# Patient Record
Sex: Female | Born: 1935 | Race: White | Hispanic: No | Marital: Married | Smoking: Former smoker
Health system: Southern US, Community
[De-identification: ages and names within clinical notes are randomized; demographics above are authoritative.]

## PROBLEM LIST (undated history)

## (undated) DIAGNOSIS — F419 Anxiety disorder, unspecified: Secondary | ICD-10-CM

## (undated) DIAGNOSIS — I10 Essential (primary) hypertension: Secondary | ICD-10-CM

## (undated) DIAGNOSIS — I471 Supraventricular tachycardia: Secondary | ICD-10-CM

## (undated) HISTORY — PX: TUBAL LIGATION: SHX77

## (undated) HISTORY — PX: APPENDECTOMY: SHX54

## (undated) HISTORY — DX: Supraventricular tachycardia: I47.1

## (undated) HISTORY — PX: HERNIA REPAIR: SHX51

---

## 1998-09-01 ENCOUNTER — Emergency Department (HOSPITAL_COMMUNITY): Admission: EM | Admit: 1998-09-01 | Discharge: 1998-09-01 | Payer: Self-pay | Admitting: Emergency Medicine

## 1998-09-01 ENCOUNTER — Encounter: Payer: Self-pay | Admitting: Emergency Medicine

## 1999-04-10 ENCOUNTER — Other Ambulatory Visit: Admission: RE | Admit: 1999-04-10 | Discharge: 1999-04-10 | Payer: Self-pay | Admitting: *Deleted

## 2000-08-19 ENCOUNTER — Other Ambulatory Visit: Admission: RE | Admit: 2000-08-19 | Discharge: 2000-08-19 | Payer: Self-pay | Admitting: *Deleted

## 2002-11-02 ENCOUNTER — Other Ambulatory Visit: Admission: RE | Admit: 2002-11-02 | Discharge: 2002-11-02 | Payer: Self-pay | Admitting: Family Medicine

## 2003-12-06 ENCOUNTER — Other Ambulatory Visit: Admission: RE | Admit: 2003-12-06 | Discharge: 2003-12-06 | Payer: Self-pay | Admitting: Family Medicine

## 2004-04-15 ENCOUNTER — Ambulatory Visit (HOSPITAL_COMMUNITY): Admission: RE | Admit: 2004-04-15 | Discharge: 2004-04-15 | Payer: Self-pay | Admitting: Gastroenterology

## 2004-04-15 ENCOUNTER — Encounter (INDEPENDENT_AMBULATORY_CARE_PROVIDER_SITE_OTHER): Payer: Self-pay | Admitting: Specialist

## 2004-06-13 ENCOUNTER — Emergency Department (HOSPITAL_COMMUNITY): Admission: EM | Admit: 2004-06-13 | Discharge: 2004-06-13 | Payer: Self-pay | Admitting: Emergency Medicine

## 2005-08-18 ENCOUNTER — Other Ambulatory Visit: Admission: RE | Admit: 2005-08-18 | Discharge: 2005-08-18 | Payer: Self-pay | Admitting: Family Medicine

## 2007-08-24 ENCOUNTER — Other Ambulatory Visit: Admission: RE | Admit: 2007-08-24 | Discharge: 2007-08-24 | Payer: Self-pay | Admitting: Family Medicine

## 2010-12-13 NOTE — Op Note (Signed)
NAMEGeana, [name]                         ACCOUNT NO.:  [id number]   MEDICAL RECORD NO.:  [id number]                   PATIENT TYPE:  AMB   LOCATION:  ENDO                                 FACILITY:  [facility]   PHYSICIAN:  Graylin Shiver, M.D.                DATE OF BIRTH:  12-22-1932   DATE OF PROCEDURE:  04/15/2004  DATE OF DISCHARGE:                                 OPERATIVE REPORT   INDICATIONS:  Screening.   Informed consent was obtained after the explanation of the risks of  bleeding, infection, and perforation.   PREMEDICATIONS:  Fentanyl 75 mcg IV, Versed 8 mg IV.   PROCEDURE:  With the patient in the left lateral decubitus position, a  rectal exam was performed.  No masses were felt.  The Olympus colonoscope  was inserted into the rectum and advanced around the colon to the cecum.  Cecal landmarks were identified.  The colon was very tortuous.  The cecum  and ascending colon were normal.  The transverse colon normal.  The  descending colon and sigmoid were normal.  In the proximal rectum, there was  a 3 mm polyp, biopsied off with cold forceps.  She tolerated the procedure  well without complications.   IMPRESSION:  A small rectal polyp.   PLAN:  The pathology will be checked.      SFG/MEDQ  D:  04/15/2004  T:  04/15/2004  Job:  [id number]   cc:   Stacie Acres. White, M.D.  [address]  [place]  [place] [address]  Fax: [phone]

## 2013-07-05 ENCOUNTER — Encounter (HOSPITAL_COMMUNITY): Payer: Self-pay | Admitting: Emergency Medicine

## 2013-07-05 ENCOUNTER — Emergency Department (HOSPITAL_COMMUNITY)
Admission: EM | Admit: 2013-07-05 | Discharge: 2013-07-05 | Disposition: A | Discharge status: Home / Self Care | Payer: Medicare Other | Attending: Emergency Medicine | Admitting: Emergency Medicine

## 2013-07-05 ENCOUNTER — Other Ambulatory Visit: Payer: Self-pay

## 2013-07-05 ENCOUNTER — Emergency Department (HOSPITAL_COMMUNITY): Payer: Medicare Other

## 2013-07-05 DIAGNOSIS — M79609 Pain in unspecified limb: Secondary | ICD-10-CM | POA: Insufficient documentation

## 2013-07-05 DIAGNOSIS — Z882 Allergy status to sulfonamides status: Secondary | ICD-10-CM | POA: Insufficient documentation

## 2013-07-05 DIAGNOSIS — I471 Supraventricular tachycardia, unspecified: Secondary | ICD-10-CM | POA: Insufficient documentation

## 2013-07-05 DIAGNOSIS — F411 Generalized anxiety disorder: Secondary | ICD-10-CM | POA: Insufficient documentation

## 2013-07-05 DIAGNOSIS — Z885 Allergy status to narcotic agent status: Secondary | ICD-10-CM | POA: Insufficient documentation

## 2013-07-05 DIAGNOSIS — Z79899 Other long term (current) drug therapy: Secondary | ICD-10-CM | POA: Insufficient documentation

## 2013-07-05 DIAGNOSIS — I10 Essential (primary) hypertension: Secondary | ICD-10-CM | POA: Insufficient documentation

## 2013-07-05 DIAGNOSIS — Z87891 Personal history of nicotine dependence: Secondary | ICD-10-CM | POA: Insufficient documentation

## 2013-07-05 DIAGNOSIS — Z8679 Personal history of other diseases of the circulatory system: Secondary | ICD-10-CM | POA: Insufficient documentation

## 2013-07-05 DIAGNOSIS — Z7982 Long term (current) use of aspirin: Secondary | ICD-10-CM | POA: Insufficient documentation

## 2013-07-05 HISTORY — DX: Essential (primary) hypertension: I10

## 2013-07-05 LAB — COMPREHENSIVE METABOLIC PANEL
ALT: 17 U/L (ref 0–35)
Albumin: 3.5 g/dL (ref 3.5–5.2)
Alkaline Phosphatase: 79 U/L (ref 39–117)
BUN: 16 mg/dL (ref 6–23)
Chloride: 100 mEq/L (ref 96–112)
Creatinine, Ser: 0.71 mg/dL (ref 0.50–1.10)
GFR calc Af Amer: >90 mL/min (ref >90)
Potassium: 3.5 mEq/L (ref 3.5–5.1)
Sodium: 137 mEq/L (ref 135–145)
Total Bilirubin: 0.9 mg/dL (ref 0.3–1.2)

## 2013-07-05 LAB — CBC WITH DIFFERENTIAL/PLATELET
Basophils Relative: 0 % (ref 0–1)
Hemoglobin: 14 g/dL (ref 12.0–15.0)
Lymphocytes Relative: 30 % (ref 12–46)
Lymphs Abs: 1.1 10*3/uL (ref 0.7–4.0)
MCHC: 34.2 g/dL (ref 30.0–36.0)
Monocytes Relative: 9 % (ref 3–12)
Neutro Abs: 2.2 10*3/uL (ref 1.7–7.7)
Neutrophils Relative %: 59 % (ref 43–77)
RBC: 4.51 MIL/uL (ref 3.87–5.11)

## 2013-07-05 LAB — TROPONIN I: Troponin I: <0.3 ng/mL (ref <0.30)

## 2013-07-05 MED ORDER — ADENOSINE 6 MG/2ML IV SOLN
6.0000 mg | Freq: Once | INTRAVENOUS | Status: AC
  Administered 2013-07-05: 6 mg via INTRAVENOUS
  Filled 2013-07-05: qty 2

## 2013-07-05 MED ORDER — METOPROLOL TARTRATE 25 MG PO TABS: 25.0000 mg | ORAL_TABLET | Freq: Two times a day (BID) | ORAL | Status: DC

## 2013-07-05 MED ORDER — METOPROLOL TARTRATE 1 MG/ML IV SOLN: 5.0000 mg | Freq: Once | INTRAVENOUS | Status: DC

## 2013-07-05 MED ORDER — SODIUM CHLORIDE 0.9 % IV SOLN
INTRAVENOUS | Status: DC
  Administered 2013-07-05: 14:00:00 via INTRAVENOUS

## 2013-07-05 MED ORDER — METOPROLOL TARTRATE 1 MG/ML IV SOLN
5.0000 mg | Freq: Once | INTRAVENOUS | Status: AC
  Administered 2013-07-05: 5 mg via INTRAVENOUS
  Filled 2013-07-05: qty 5

## 2013-07-05 MED ORDER — METOPROLOL TARTRATE 25 MG PO TABS
25.0000 mg | ORAL_TABLET | Freq: Once | ORAL | Status: AC
  Administered 2013-07-05: 25 mg via ORAL
  Filled 2013-07-05: qty 1

## 2013-07-05 MED ORDER — METOPROLOL TARTRATE 1 MG/ML IV SOLN
2.5000 mg | Freq: Once | INTRAVENOUS | Status: DC
  Filled 2013-07-05: qty 5

## 2013-07-05 NOTE — ED Notes (Signed)
Pt here from the MD office for tingling in the left arm and was found to be in ST 150's , no chest pain , no sob, no n/v

## 2013-07-05 NOTE — ED Notes (Signed)
Husband(Harlod Rolston)- [phone]

## 2013-07-05 NOTE — ED Provider Notes (Signed)
CSN: [id number]     Arrival date & time 07/05/13  1354 History   First MD Initiated Contact with Patient 07/05/13 1400     Chief Complaint  Patient presents with  . Irregular Heart Beat   (Consider location/radiation/quality/duration/timing/severity/associated sxs/prior Treatment) The history is provided by the patient and the spouse.   Patient here complaining of intermittent left arm pain x1 week that is worse at night. No associated chest pain or chest pressure. No diaphoresis. Pain worse with movement. Went to her doctor's office today because she was feeling more anxious and heart rate was noted to be 160. EMS was called and patient transported here. Patient denies any syncope or near-syncope. Denies any prior history of same. Denies any excessive caffeine or alcohol use. EMS gave the patient IV fluids and oxygen. She denies any recent black or bloody stools or illnesses Past Medical History  Diagnosis Date  . Hypertension    Past Surgical History  Procedure Laterality Date  . Appendectomy    . Hernia repair    . Tubal ligation     History reviewed. No pertinent family history. History  Substance Use Topics  . Smoking status: Former Games developer  . Smokeless tobacco: Not on file  . Alcohol Use: No   OB History   Grav Para Term Preterm Abortions TAB SAB Ect Mult Living                 Review of Systems  All other systems reviewed and are negative.    Allergies  Codeine and Sulfa antibiotics  Home Medications  No current outpatient prescriptions on file. There were no vitals taken for this visit. Physical Exam  Nursing note and vitals reviewed. Constitutional: She is oriented to person, place, and time. She appears well-developed and well-nourished.  Non-toxic appearance. No distress.  HENT:  Head: Normocephalic and atraumatic.  Eyes: Conjunctivae, EOM and lids are normal. Pupils are equal, round, and reactive to light.  Neck: Normal range of motion. Neck supple. No  tracheal deviation present. No mass present.  Cardiovascular: Regular rhythm and normal heart sounds.  Tachycardia present.  Exam reveals no gallop.   No murmur heard. Pulmonary/Chest: Effort normal and breath sounds normal. No stridor. No respiratory distress. She has no decreased breath sounds. She has no wheezes. She has no rhonchi. She has no rales.  Abdominal: Soft. Normal appearance and bowel sounds are normal. She exhibits no distension. There is no tenderness. There is no rebound and no CVA tenderness.  Musculoskeletal: Normal range of motion. She exhibits no edema and no tenderness.  Neurological: She is alert and oriented to person, place, and time. She has normal strength. No cranial nerve deficit or sensory deficit. GCS eye subscore is 4. GCS verbal subscore is 5. GCS motor subscore is 6.  Skin: Skin is warm and dry. No abrasion and no rash noted.  Psychiatric: She has a normal mood and affect. Her speech is normal and behavior is normal.    ED Course  Procedures (including critical care time) Labs Review Labs Reviewed  TROPONIN I  CBC WITH DIFFERENTIAL  COMPREHENSIVE METABOLIC PANEL  T4  TSH   Imaging Review No results found.  EKG Interpretation   None       MDM  No diagnosis found.  Date: 07/05/2013  Rate: 160  Rhythm: sinus tachycardia  QRS Axis: normal  Intervals: normal  ST/T Wave abnormalities: normal  Conduction Disutrbances:none  Narrative Interpretation:   Old EKG Reviewed: none available  Patient given adenosine on arrival for her sinus tachycardia and immediately converted to sinus rhythm. We'll continue to monitor.  3:53 PM Patient and back into SVT and was given Lopressor 5 mg IV push. Heart rate is now down to 70 and in sinus rhythm. Consult with cardiology, Dr. Patty Sermons, he requested that I speak with one of his partners to come and see the patient. Patient continued to be monitored. She has remained hemodynamically stable   CRITICAL  CARE Performed by: Toy Baker Total critical care time: 50 Critical care time was exclusive of separately billable procedures and treating other patients. Critical care was necessary to treat or prevent imminent or life-threatening deterioration. Critical care was time spent personally by me on the following activities: development of treatment plan with patient and/or surrogate as well as nursing, discussions with consultants, evaluation of patient's response to treatment, examination of patient, obtaining history from patient or surrogate, ordering and performing treatments and interventions, ordering and review of laboratory studies, ordering and review of radiographic studies, pulse oximetry and re-evaluation of patient's condition.    Toy Baker, MD 07/05/13 [phone]

## 2013-07-05 NOTE — ED Provider Notes (Signed)
I discussed the patient's case with our cardiology team.  The recommendation is for initiation of metoprolol, 25 daily, twice.  She'll be discharged.  I informed the patient and her husband of this recommendation.  She will receive a followup phone call tomorrow to schedule additional evaluation and management.  On re-check she is in no distress.  HR is sinus  Gerhard Munch, MD 07/05/13 [phone]

## 2013-07-06 LAB — T4: T4, Total: 8.9 ug/dL (ref 5.0–12.5)

## 2013-07-06 LAB — TSH: TSH: 1.074 u[IU]/mL (ref 0.350–4.500)

## 2013-07-08 ENCOUNTER — Ambulatory Visit (INDEPENDENT_AMBULATORY_CARE_PROVIDER_SITE_OTHER): Payer: Medicare Other | Admitting: Cardiovascular Disease

## 2013-07-08 ENCOUNTER — Encounter: Payer: Self-pay | Admitting: *Deleted

## 2013-07-08 VITALS — BP 130/90 | HR 68 | Ht 62.0 in | Wt 155.0 lb

## 2013-07-08 DIAGNOSIS — I10 Essential (primary) hypertension: Secondary | ICD-10-CM | POA: Insufficient documentation

## 2013-07-08 DIAGNOSIS — I471 Supraventricular tachycardia: Secondary | ICD-10-CM

## 2013-07-08 DIAGNOSIS — I498 Other specified cardiac arrhythmias: Secondary | ICD-10-CM

## 2013-07-08 MED ORDER — PROPRANOLOL HCL 10 MG PO TABS: 10.0000 mg | ORAL_TABLET | ORAL | Status: DC | PRN

## 2013-07-08 NOTE — Progress Notes (Signed)
Patient ID: [name], female   DOB: 02/04/1933, 79 y.o.   MRN: [id number]   79 yo seen in ER 12/9 for SVT She has never had a previous heart problem Given adenosine and iv lopressor Despite rates in 160 had no chest pain mild dyspnea and no  Pre synocpe Had a cold but no other ppt factors Had done a lot of yard work and pain in left forearm and hand but this was muscular  D/C on metroprolol 25 bid.  CRF;s elevated lipids on crestor and HTN.  Active Retired as IT consultant.  No history of arrhythmia or thyroid disease.  TSH and K normal in ER  Reviewed labs HP's and ECGs  No preexcitation on ECGs Baseline ECG when not in SVT is normal    ROS: Denies fever, malais, weight loss, blurry vision, decreased visual acuity, cough, sputum, SOB, hemoptysis, pleuritic pain, palpitaitons, heartburn, abdominal pain, melena, lower extremity edema, claudication, or rash.  All other systems reviewed and negative   General: Affect appropriate Healthy:  appears stated age HEENT: normal Neck supple with no adenopathy JVP normal no bruits no thyromegaly Lungs clear with no wheezing and good diaphragmatic motion Heart:  S1/S2 no murmur,rub, gallop or click PMI normal Abdomen: benighn, BS positve, no tenderness, no AAA no bruit.  No HSM or HJR Distal pulses intact with no bruits No edema Neuro non-focal Skin warm and dry No muscular weakness  Medications Current Outpatient Prescriptions  Medication Sig Dispense Refill  . aspirin EC 81 MG tablet Take 81 mg by mouth daily.      . hydrochlorothiazide (HYDRODIURIL) 25 MG tablet Take 25 mg by mouth daily.      Marland Kitchen ibuprofen (ADVIL,MOTRIN) 200 MG tablet Take 200 mg by mouth every 6 (six) hours as needed for headache (cold symptoms).       . metoprolol tartrate (LOPRESSOR) 25 MG tablet Take 1 tablet (25 mg total) by mouth 2 (two) times daily.  60 tablet  0  . Rosuvastatin Calcium (CRESTOR PO) Take 10 mg by mouth daily.        No current  facility-administered medications for this visit.    Allergies Codeine and Sulfa antibiotics  Family History: No family history on file.  Social History: History   Social History  . Marital Status: Married    Spouse Name: N/A    Number of Children: N/A  . Years of Education: N/A   Occupational History  . Not on file.   Social History Main Topics  . Smoking status: Former Games developer  . Smokeless tobacco: Not on file  . Alcohol Use: No  . Drug Use: Not on file  . Sexual Activity: Not on file   Other Topics Concern  . Not on file   Social History Narrative  . No narrative on file    Electrocardiogram:12/9  SRV rate 140  After brake NSR normal   Assessment and Plan

## 2013-07-08 NOTE — Assessment & Plan Note (Signed)
One occurrence. Check echo for structural heart disease.  Wean off beta blocker PRN inderal to have instead No indication for EP referral at this time

## 2013-07-08 NOTE — Assessment & Plan Note (Signed)
Well controlled.  Continue current medications and low sodium Dash type diet.   If she starts having frequent episodes can consider stopping diuretic and maint on beta blocker

## 2013-07-08 NOTE — Patient Instructions (Addendum)
Your physician wants you to follow-up in:   6 MONTHS WITH   DR Haywood Filler will receive a reminder letter in the mail two months in advance. If you don't receive a letter, please call our office to schedule the follow-up appointment.  Your physician has requested that you have an echocardiogram. Echocardiography is a painless test that uses sound waves to create images of your heart. It provides your doctor with information about the size and shape of your heart and how well your heart's chambers and valves are working. This procedure takes approximately one hour. There are no restrictions for this procedure.   Your physician has recommended you make the following change in your medication: DECREASE  METOPROLOL TO   1  TAB  DAILY   FOR  10  DAYS    TO  WEAN  OFF OF MED  ALSO  MAY  TAKE  INDERAL   10 MG  AS NEED\ED FOR  FAST  HEART RATE

## 2013-07-27 ENCOUNTER — Ambulatory Visit (HOSPITAL_COMMUNITY): Payer: Medicare Other | Attending: Cardiovascular Disease | Admitting: Radiology

## 2013-07-27 DIAGNOSIS — I10 Essential (primary) hypertension: Secondary | ICD-10-CM | POA: Insufficient documentation

## 2013-07-27 DIAGNOSIS — I471 Supraventricular tachycardia, unspecified: Secondary | ICD-10-CM | POA: Insufficient documentation

## 2013-07-27 DIAGNOSIS — I079 Rheumatic tricuspid valve disease, unspecified: Secondary | ICD-10-CM | POA: Insufficient documentation

## 2013-07-27 DIAGNOSIS — I359 Nonrheumatic aortic valve disorder, unspecified: Secondary | ICD-10-CM | POA: Insufficient documentation

## 2013-07-27 DIAGNOSIS — I379 Nonrheumatic pulmonary valve disorder, unspecified: Secondary | ICD-10-CM | POA: Insufficient documentation

## 2013-07-27 DIAGNOSIS — I498 Other specified cardiac arrhythmias: Secondary | ICD-10-CM

## 2013-07-27 DIAGNOSIS — E785 Hyperlipidemia, unspecified: Secondary | ICD-10-CM | POA: Insufficient documentation

## 2013-07-27 NOTE — Progress Notes (Signed)
Echocardiogram performed.  

## 2014-01-04 ENCOUNTER — Encounter: Payer: Self-pay | Admitting: Cardiovascular Disease

## 2014-01-05 ENCOUNTER — Ambulatory Visit (INDEPENDENT_AMBULATORY_CARE_PROVIDER_SITE_OTHER): Payer: Medicare Other | Admitting: Cardiovascular Disease

## 2014-01-05 ENCOUNTER — Encounter: Payer: Self-pay | Admitting: Cardiovascular Disease

## 2014-01-05 VITALS — BP 126/84 | HR 88 | Ht 64.0 in | Wt 154.8 lb

## 2014-01-05 DIAGNOSIS — I471 Supraventricular tachycardia: Secondary | ICD-10-CM

## 2014-01-05 DIAGNOSIS — I10 Essential (primary) hypertension: Secondary | ICD-10-CM

## 2014-01-05 DIAGNOSIS — I498 Other specified cardiac arrhythmias: Secondary | ICD-10-CM

## 2014-01-05 NOTE — Assessment & Plan Note (Signed)
No recurrence  Continue PRN inderal

## 2014-01-05 NOTE — Patient Instructions (Signed)
Your physician wants you to follow-up in: YEAR WITH DR NISHAN  You will receive a reminder letter in the mail two months in advance. If you don't receive a letter, please call our office to schedule the follow-up appointment.  Your physician recommends that you continue on your current medications as directed. Please refer to the Current Medication list given to you today. 

## 2014-01-05 NOTE — Assessment & Plan Note (Signed)
Well controlled.  Continue current medications and low sodium Dash type diet.    

## 2014-01-05 NOTE — Progress Notes (Signed)
Patient ID: [name], female   DOB: 11/22/1932, 80 y.o.   MRN: [id number] 80 yo seen in ER 12/9 for SVT She has never had a previous heart problem Given adenosine and iv lopressor Despite rates in 160 had no chest pain mild dyspnea and no  Pre synocpe Had a cold but no other ppt factors Had done a lot of yard work and pain in left forearm and hand but this was muscular D/C on metroprolol 25 bid. CRF;s elevated lipids on crestor and HTN. 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 TSH and K normal in ER Reviewed labs HP's and ECGs No preexcitation on ECGs Baseline ECG when not in SVT is normal   Echo 07/27/13 Study Conclusions  - Left ventricle: The cavity size was normal. Wall thickness was normal. Systolic function was normal. The estimated ejection fraction was in the range of 55% to 60%. Wall motion was normal; there were no regional wall motion abnormalities. Doppler parameters are consistent with abnormal left ventricular relaxation (grade 1 diastolic dysfunction). - Aortic valve: There was no stenosis. Mild regurgitation. - Mitral valve: Mildly calcified annulus. Normal thickness leaflets . No significant regurgitation. - Left atrium: The atrium was mildly dilated. - Right ventricle: The cavity size was normal. Systolic function was normal. - Tricuspid valve: Peak RV-RA gradient: 68mm Hg (S). - Pulmonary arteries: PA peak pressure: 63mm Hg (S). - Inferior vena cava: The vessel was normal in size; the respirophasic diameter changes were in the normal range (= 50%); findings are consistent with normal central venous pressure. Impressions:  - Normal LV size and systolic function, EF 55-60%. Normal RV size and systolic function. Mild aortic insufficiency.   Will celebrate 40 th anniversary in October  Only taken inderal once   ROS: Denies fever, malais, weight loss, blurry vision, decreased visual acuity, cough, sputum, SOB,  hemoptysis, pleuritic pain, palpitaitons, heartburn, abdominal pain, melena, lower extremity edema, claudication, or rash.  All other systems reviewed and negative  General: Affect appropriate Healthy:  appears stated age HEENT: normal Neck supple with no adenopathy JVP normal no bruits no thyromegaly Lungs clear with no wheezing and good diaphragmatic motion Heart:  S1/S2 no murmur, no rub, gallop or click PMI normal Abdomen: benighn, BS positve, no tenderness, no AAA no bruit.  No HSM or HJR Distal pulses intact with no bruits No edema Neuro non-focal Skin warm and dry No muscular weakness   Current Outpatient Prescriptions  Medication Sig Dispense Refill  . aspirin EC 81 MG tablet Take 81 mg by mouth daily.      . hydrochlorothiazide (HYDRODIURIL) 25 MG tablet Take 25 mg by mouth daily.      . propranolol (INDERAL) 10 MG tablet Take 1 tablet (10 mg total) by mouth as needed.  30 tablet  3  . Rosuvastatin Calcium (CRESTOR PO) Take 10 mg by mouth daily.        No current facility-administered medications for this visit.    Allergies  Codeine and Sulfa antibiotics  Electrocardiogram:  SR tate 91 normal   Assessment and Plan

## 2014-11-23 ENCOUNTER — Encounter: Payer: Self-pay | Admitting: Cardiovascular Disease

## 2014-11-23 ENCOUNTER — Telehealth: Payer: Self-pay | Admitting: Cardiovascular Disease

## 2014-11-23 NOTE — Telephone Encounter (Signed)
ERROR, Trying to send letter

## 2015-01-10 ENCOUNTER — Ambulatory Visit: Payer: Self-pay | Admitting: Cardiovascular Disease

## 2015-01-30 ENCOUNTER — Encounter: Payer: Self-pay | Admitting: *Deleted

## 2015-02-01 NOTE — Progress Notes (Signed)
Patient ID: [name], female   DOB: 05-29-1933, 81 y.o.   MRN: [id number] 81 y.o.  seen in ER 07/05/13  for SVT She has never had a previous heart problem Given adenosine and iv lopressor Despite rates in 160 had no chest pain mild dyspnea and no  Pre synocpe Had a cold but no other ppt factors Had done a lot of yard work and pain in left forearm and hand but this was muscular D/C on metroprolol 25 bid. CRF;s elevated lipids on crestor and HTN. 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 No history of arrhythmia or thyroid disease. TSH and K normal in ER Reviewed labs HP's and ECGs No preexcitation on ECGs Baseline ECG when not in SVT is normal   Echo 07/27/13 Study Conclusions  - Left ventricle: The cavity size was normal. Wall thickness was normal. Systolic function was normal. The estimated ejection fraction was in the range of 55% to 60%. Wall motion was normal; there were no regional wall motion abnormalities. Doppler parameters are consistent with abnormal left ventricular relaxation (grade 1 diastolic dysfunction). - Aortic valve: There was no stenosis. Mild regurgitation. - Mitral valve: Mildly calcified annulus. Normal thickness leaflets . No significant regurgitation. - Left atrium: The atrium was mildly dilated. - Right ventricle: The cavity size was normal. Systolic function was normal. - Tricuspid valve: Peak RV-RA gradient: 15mm Hg (S). - Pulmonary arteries: PA peak pressure: 20mm Hg (S). - Inferior vena cava: The vessel was normal in size; the respirophasic diameter changes were in the normal range (= 50%); findings are consistent with normal central venous pressure. Impressions:  - Normal LV size and systolic function, EF 55-60%. Normal RV size and systolic function. Mild aortic insufficiency.  In office today in rapid flutter.  Rate 153-160  She has mild dyspnea no chest pain     ROS: Denies fever, malais, weight loss, blurry vision, decreased visual  acuity, cough, sputum, SOB, hemoptysis, pleuritic pain, palpitaitons, heartburn, abdominal pain, melena, lower extremity edema, claudication, or rash.  All other systems reviewed and negative  General: Affect appropriate Healthy:  appears stated age HEENT: normal Neck supple with no adenopathy JVP normal no bruits no thyromegaly Lungs clear with no wheezing and good diaphragmatic motion Heart:  S1/S2 no murmur, no rub, gallop or click PMI normal Abdomen: benighn, BS positve, no tenderness, no AAA no bruit.  No HSM or HJR Distal pulses intact with no bruits No edema Neuro non-focal Skin warm and dry No muscular weakness   Current Outpatient Prescriptions  Medication Sig Dispense Refill  . aspirin EC 81 MG tablet Take 81 mg by mouth daily.    . hydrochlorothiazide (HYDRODIURIL) 25 MG tablet Take 25 mg by mouth daily.    . sertraline (ZOLOFT) 50 MG tablet Take 50 mg by mouth daily. Pt states that she takes 1/2 tablet by mouth in the AM    . propranolol (INDERAL) 10 MG tablet Take 1 tablet (10 mg total) by mouth as needed. (Patient not taking: Reported on 02/02/2015) 30 tablet 3   No current facility-administered medications for this visit.    Allergies  Codeine and Sulfa antibiotics  Electrocardiogram: 06/2014  SR tate 91 normal  02/02/15  Atrial flutter rate 153  IVCD   Assessment and Plan Atrial Flutter:  Admit to [facility].  Start iv cardizem Start eliquis 5 bid.  If she does not convert would plan TEE/DCC Monday HTN:  On diuretic check BMET and labs on admission to hospital

## 2015-02-02 ENCOUNTER — Ambulatory Visit (INDEPENDENT_AMBULATORY_CARE_PROVIDER_SITE_OTHER): Payer: Medicare Other | Admitting: Cardiovascular Disease

## 2015-02-02 ENCOUNTER — Other Ambulatory Visit: Payer: Self-pay

## 2015-02-02 ENCOUNTER — Encounter (HOSPITAL_COMMUNITY): Payer: Self-pay | Admitting: General Practice

## 2015-02-02 ENCOUNTER — Observation Stay (HOSPITAL_COMMUNITY)
Admission: AD | Admit: 2015-02-02 | Discharge: 2015-02-04 | Disposition: A | Discharge status: Home / Self Care | Payer: Medicare Other | Source: Ambulatory Visit | Attending: Cardiovascular Disease | Admitting: Cardiovascular Disease

## 2015-02-02 ENCOUNTER — Encounter: Payer: Self-pay | Admitting: Cardiovascular Disease

## 2015-02-02 VITALS — BP 120/78 | HR 153 | Ht 64.0 in

## 2015-02-02 DIAGNOSIS — I471 Supraventricular tachycardia: Secondary | ICD-10-CM | POA: Insufficient documentation

## 2015-02-02 DIAGNOSIS — I483 Typical atrial flutter: Secondary | ICD-10-CM

## 2015-02-02 DIAGNOSIS — I10 Essential (primary) hypertension: Secondary | ICD-10-CM | POA: Diagnosis present

## 2015-02-02 DIAGNOSIS — I440 Atrioventricular block, first degree: Secondary | ICD-10-CM | POA: Diagnosis not present

## 2015-02-02 DIAGNOSIS — Z7982 Long term (current) use of aspirin: Secondary | ICD-10-CM | POA: Diagnosis not present

## 2015-02-02 DIAGNOSIS — Z7901 Long term (current) use of anticoagulants: Secondary | ICD-10-CM

## 2015-02-02 DIAGNOSIS — I4581 Long QT syndrome: Secondary | ICD-10-CM | POA: Insufficient documentation

## 2015-02-02 DIAGNOSIS — R9431 Abnormal electrocardiogram [ECG] [EKG]: Secondary | ICD-10-CM | POA: Diagnosis not present

## 2015-02-02 DIAGNOSIS — R001 Bradycardia, unspecified: Secondary | ICD-10-CM | POA: Diagnosis not present

## 2015-02-02 DIAGNOSIS — I4892 Unspecified atrial flutter: Principal | ICD-10-CM | POA: Insufficient documentation

## 2015-02-02 HISTORY — DX: Anxiety disorder, unspecified: F41.9

## 2015-02-02 LAB — CBC WITH DIFFERENTIAL/PLATELET
BASOS PCT: 0 % (ref 0–1)
Basophils Absolute: 0 10*3/uL (ref 0.0–0.1)
EOS ABS: 0.1 10*3/uL (ref 0.0–0.7)
EOS PCT: 1 % (ref 0–5)
HCT: 41.8 % (ref 36.0–46.0)
Hemoglobin: 14.1 g/dL (ref 12.0–15.0)
Lymphocytes Relative: 24 % (ref 12–46)
Lymphs Abs: 1.3 10*3/uL (ref 0.7–4.0)
MCH: 30.9 pg (ref 26.0–34.0)
MCHC: 33.7 g/dL (ref 30.0–36.0)
MCV: 91.7 fL (ref 78.0–100.0)
Monocytes Absolute: 0.5 10*3/uL (ref 0.1–1.0)
Monocytes Relative: 10 % (ref 3–12)
Neutro Abs: 3.4 10*3/uL (ref 1.7–7.7)
Neutrophils Relative %: 65 % (ref 43–77)
PLATELETS: 205 10*3/uL (ref 150–400)
RBC: 4.56 MIL/uL (ref 3.87–5.11)
RDW: 13.5 % (ref 11.5–15.5)
WBC: 5.2 10*3/uL (ref 4.0–10.5)

## 2015-02-02 LAB — BASIC METABOLIC PANEL
Anion gap: 8 (ref 5–15)
BUN: 15 mg/dL (ref 6–20)
CHLORIDE: 99 mmol/L — AB (ref 101–111)
CO2: 29 mmol/L (ref 22–32)
CREATININE: 0.86 mg/dL (ref 0.44–1.00)
Calcium: 8.9 mg/dL (ref 8.9–10.3)
GFR calc Af Amer: >60 mL/min (ref >60)
Glucose, Bld: 108 mg/dL — ABNORMAL HIGH (ref 65–99)
Potassium: 3.7 mmol/L (ref 3.5–5.1)
Sodium: 136 mmol/L (ref 135–145)

## 2015-02-02 LAB — TSH: TSH: 1.101 u[IU]/mL (ref 0.350–4.500)

## 2015-02-02 LAB — PROTIME-INR
INR: 1.05 (ref 0.00–1.49)
PROTHROMBIN TIME: 13.9 s (ref 11.6–15.2)

## 2015-02-02 LAB — APTT: aPTT: 28 seconds (ref 24–37)

## 2015-02-02 MED ORDER — SERTRALINE HCL 50 MG PO TABS
25.0000 mg | ORAL_TABLET | Freq: Every day | ORAL | Status: DC
  Administered 2015-02-03 – 2015-02-04 (×2): 25 mg via ORAL
  Filled 2015-02-02 (×3): qty 0.5

## 2015-02-02 MED ORDER — APIXABAN 5 MG PO TABS
5.0000 mg | ORAL_TABLET | Freq: Two times a day (BID) | ORAL | Status: DC
  Administered 2015-02-02 – 2015-02-04 (×5): 5 mg via ORAL
  Filled 2015-02-02 (×6): qty 1

## 2015-02-02 MED ORDER — DILTIAZEM LOAD VIA INFUSION
15.0000 mg | Freq: Once | INTRAVENOUS | Status: AC
  Administered 2015-02-02: 15 mg via INTRAVENOUS
  Filled 2015-02-02: qty 15

## 2015-02-02 MED ORDER — HYDROCHLOROTHIAZIDE 25 MG PO TABS
25.0000 mg | ORAL_TABLET | Freq: Every day | ORAL | Status: DC
  Administered 2015-02-03 – 2015-02-04 (×2): 25 mg via ORAL
  Filled 2015-02-02 (×3): qty 1

## 2015-02-02 MED ORDER — DILTIAZEM HCL 100 MG IV SOLR
5.0000 mg/h | INTRAVENOUS | Status: DC
  Administered 2015-02-02: 5 mg/h via INTRAVENOUS
  Filled 2015-02-02: qty 100

## 2015-02-02 NOTE — Discharge Instructions (Addendum)
Information on my medicine - ELIQUIS (apixaban)  This medication education was reviewed with me or my healthcare representative as part of my discharge preparation.  The pharmacist that spoke with me during my hospital stay was:  Wilhemina BonitoLee, Samson C, [facility]  Why was Eliquis prescribed for you? Eliquis was prescribed for you to reduce the risk of a blood clot forming that can cause a stroke if you have a medical condition called atrial fibrillation (a type of irregular heartbeat).  What do You need to know about Eliquis ? Take your Eliquis TWICE DAILY - one tablet in the morning and one tablet in the evening with or without food. If you have difficulty swallowing the tablet whole please discuss with your pharmacist how to take the medication safely.  Take Eliquis exactly as prescribed by your doctor and DO NOT stop taking Eliquis without talking to the doctor who prescribed the medication.  Stopping may increase your risk of developing a stroke.  Refill your prescription before you run out.  After discharge, you should have regular check-up appointments with your healthcare provider that is prescribing your Eliquis.  In the future your dose may need to be changed if your kidney function or weight changes by a significant amount or as you get older.  What do you do if you miss a dose? If you miss a dose, take it as soon as you remember on the same day and resume taking twice daily.  Do not take more than one dose of ELIQUIS at the same time to make up a missed dose.  Important Safety Information A possible side effect of Eliquis is bleeding. You should call your healthcare provider right away if you experience any of the following: ? Bleeding from an injury or your nose that does not stop. ? Unusual colored urine (red or dark brown) or unusual colored stools (red or black). ? Unusual bruising for unknown reasons. ? A serious fall or if you hit your head (even if there is no bleeding).  Some  medicines may interact with Eliquis and might increase your risk of bleeding or clotting while on Eliquis. To help avoid this, consult your healthcare provider or pharmacist prior to using any new prescription or non-prescription medications, including herbals, vitamins, non-steroidal anti-inflammatory drugs (NSAIDs) and supplements.  This website has more information on Eliquis (apixaban): http://www.eliquis.com/eliquis/home Supraventricular Tachycardia Supraventricular tachycardia (SVT) is when the heart beats very fast. SVT can last for a long time (sustained) or it can start and stop suddenly (nonsustained). HOME CARE   Take your heart medicine as told by your doctor. Check with your doctor before taking cold, diet, or herbal medicine.  Do not smoke.  Do not drink large amounts of caffeine.Caffeine is found in coffee, tea, soda (pop, cola), and chocolate.  Keep all doctor visits as told. GET HELP RIGHT AWAY IF:   You have chest pain or pressure.  You cannot catch your breath.  You are dizzy or lightheaded.  You feel like you will pass out (faint).  You are sweaty (diaphoretic) and feel sick to your stomach (nauseous) or throw up (vomit).  If you have the above problems, call your local emergency services (911 in [place] right away. Do not drive yourself to the hospital. MAKE SURE YOU:   Understand these instructions.  Will watch your condition.  Will get help right away if you are not doing well or get worse. Document Released: 07/14/2005 Document Revised: 10/06/2011 Document Reviewed: 10/18/2008 Exit[employer] Patient Information 2015  [employer]. This information is not intended to replace advice given to you by your health care provider. Make sure you discuss any questions you have with your health care provider.

## 2015-02-02 NOTE — Progress Notes (Signed)
Pt arrived to the unit as a direct admit from MD office; pt A&O x4, telemetry applied and verified; VSS; IV site established; pt oriented to the unit and room; skin clean, dry and intact; no pressures noted; Dr Eden EmmsNishan notified of pt arrival to the unit; will closely monitor pt. Christine MerlesP. Amo Ymani Porcher RN.

## 2015-02-02 NOTE — H&P (Signed)
   Expand All Collapse All   Patient ID: [name], female DOB: Oct 15, 1932, 81 y.o. MRN: [id number] 81 y.o. seen in ER 07/05/13 for SVT She has never had a previous heart problem Given adenosine and iv lopressor Despite rates in 160 had no chest pain mild dyspnea and no  Pre synocpe Had a cold but no other ppt factors Had done a lot of yard work and pain in left forearm and hand but this was muscular D/C on metroprolol 25 bid. CRF;s elevated lipids on crestor and HTN. 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 No history of arrhythmia or thyroid disease. TSH and K normal in ER Reviewed labs HP's and ECGs No preexcitation on ECGs Baseline ECG when not in SVT is normal   Echo 07/27/13 Study Conclusions  - Left ventricle: The cavity size was normal. Wall thickness was normal. Systolic function was normal. The estimated ejection fraction was in the range of 55% to 60%. Wall motion was normal; there were no regional wall motion abnormalities. Doppler parameters are consistent with abnormal left ventricular relaxation (grade 1 diastolic dysfunction). - Aortic valve: There was no stenosis. Mild regurgitation. - Mitral valve: Mildly calcified annulus. Normal thickness leaflets . No significant regurgitation. - Left atrium: The atrium was mildly dilated. - Right ventricle: The cavity size was normal. Systolic function was normal. - Tricuspid valve: Peak RV-RA gradient: 15mm Hg (S). - Pulmonary arteries: PA peak pressure: 20mm Hg (S). - Inferior vena cava: The vessel was normal in size; the respirophasic diameter changes were in the normal range (= 50%); findings are consistent with normal central venous pressure. Impressions:  - Normal LV size and systolic function, EF 55-60%. Normal RV size and systolic function. Mild aortic insufficiency.  In office today in rapid flutter. Rate 153-160 She has mild dyspnea no chest pain    ROS: Denies fever, malais, weight loss, blurry  vision, decreased visual acuity, cough, sputum, SOB, hemoptysis, pleuritic pain, palpitaitons, heartburn, abdominal pain, melena, lower extremity edema, claudication, or rash. All other systems reviewed and negative  General: Affect appropriate Healthy: appears stated age HEENT: normal Neck supple with no adenopathy JVP normal no bruits no thyromegaly Lungs clear with no wheezing and good diaphragmatic motion Heart: S1/S2 no murmur, no rub, gallop or click PMI normal Abdomen: benighn, BS positve, no tenderness, no AAA no bruit. No HSM or HJR Distal pulses intact with no bruits No edema Neuro non-focal Skin warm and dry No muscular weakness   Current Outpatient Prescriptions  Medication Sig Dispense Refill  . aspirin EC 81 MG tablet Take 81 mg by mouth daily.    . hydrochlorothiazide (HYDRODIURIL) 25 MG tablet Take 25 mg by mouth daily.    . sertraline (ZOLOFT) 50 MG tablet Take 50 mg by mouth daily. Pt states that she takes 1/2 tablet by mouth in the AM    . propranolol (INDERAL) 10 MG tablet Take 1 tablet (10 mg total) by mouth as needed. (Patient not taking: Reported on 02/02/2015) 30 tablet 3   No current facility-administered medications for this visit.    Allergies  Codeine and Sulfa antibiotics  Electrocardiogram: 06/2014 SR tate 91 normal 02/02/15 Atrial flutter rate 153 IVCD   Assessment and Plan Atrial Flutter: Admit to [facility]. Start iv cardizem Start eliquis 5 bid. If she does not convert would plan TEE/DCC Monday HTN: On diuretic check BMET and labs on admission to hospital

## 2015-02-02 NOTE — Progress Notes (Signed)
Was A-flutter on arrival on telemetry, cardizem drip started and pt was showing NSR on telemetry. Central Monitoring called RN to inform her pt had a 2.5sec pause; NP Brion AlimentBerge notified and he ordered EKG. Pt asymptomatic and remains comfortable in room watching TV with spouse at bedside. Will closely monitor. Arabella MerlesP. Amo Abbigaile Rockman RN.

## 2015-02-03 DIAGNOSIS — Z7901 Long term (current) use of anticoagulants: Secondary | ICD-10-CM | POA: Diagnosis not present

## 2015-02-03 DIAGNOSIS — R001 Bradycardia, unspecified: Secondary | ICD-10-CM | POA: Diagnosis not present

## 2015-02-03 DIAGNOSIS — I483 Typical atrial flutter: Secondary | ICD-10-CM | POA: Diagnosis not present

## 2015-02-03 DIAGNOSIS — I4892 Unspecified atrial flutter: Secondary | ICD-10-CM | POA: Diagnosis not present

## 2015-02-03 DIAGNOSIS — I10 Essential (primary) hypertension: Secondary | ICD-10-CM | POA: Diagnosis not present

## 2015-02-03 MED ORDER — DILTIAZEM HCL ER COATED BEADS 240 MG PO CP24
240.0000 mg | ORAL_CAPSULE | Freq: Every day | ORAL | Status: DC
  Administered 2015-02-03: 240 mg via ORAL
  Filled 2015-02-03 (×2): qty 1

## 2015-02-03 NOTE — Progress Notes (Signed)
  Cardiologist: Dr. Eden EmmsNishan  Subjective:  In and out of AFLUTTER No real symptoms. Transient dizziness at home? No CP, no SOB  Objective:  Vital Signs in the last 24 hours: Temp:  [97.1 F (36.2 C)-98.2 F (36.8 C)] 97.7 F (36.5 C) (07/09 0840) Pulse Rate:  [54-139] 134 (07/09 0916) Resp:  [18-20] 20 (07/09 0840) BP: (106-132)/(49-84) 117/75 mmHg (07/09 0916) SpO2:  [95 %-97 %] 95 % (07/09 0840) Weight:  [148 lb 14.4 oz (67.541 kg)-152 lb 9.6 oz (69.219 kg)] 148 lb 14.4 oz (67.541 kg) (07/09 0531)  Intake/Output from previous day: 07/08 0701 - 07/09 0700 In: 2900.9 [P.O.:720; I.V.:2180.9] Out: 2000 [Urine:2000]   Physical Exam: General: Well developed, well nourished, in no acute distress. Head:  Normocephalic and atraumatic. Lungs: Clear to auscultation and percussion. Heart: Normal S1 and S2.  No murmur, rubs or gallops.  Abdomen: soft, non-tender, positive bowel sounds. Extremities: No clubbing or cyanosis. No edema. Neurologic: Alert and oriented x 3.    Lab Results:  Recent Labs  02/02/15 1146  WBC 5.2  HGB 14.1  PLT 205    Recent Labs  02/02/15 1146  NA 136  K 3.7  CL 99*  CO2 29  GLUCOSE 108*  BUN 15  CREATININE 0.86   TSH normal  Telemetry: Parox Aflutter, now NSR Personally viewed.  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  1. Atrial flutter  - now NSR but earlier this AM 150's when going to bathroom in atrial flutter  - will convert IV dilt 10 to dilt DC 240.  - Continue eliquis. Understands rational.  - Ambulate. Potential DC later today or tomorrow AM if she is able to maintain adequate control.    SKAINS, MARK 02/03/2015, 10:39 AM

## 2015-02-03 NOTE — Progress Notes (Signed)
Pt. heart rate sustaing 140 s Cardizem gtt stopped at approx. 830 pm Rhonda notified of HR and restarted Cardizem gtt at 5 per orders.

## 2015-02-04 ENCOUNTER — Other Ambulatory Visit: Payer: Self-pay | Admitting: Cardiology

## 2015-02-04 DIAGNOSIS — I440 Atrioventricular block, first degree: Secondary | ICD-10-CM | POA: Diagnosis not present

## 2015-02-04 DIAGNOSIS — R001 Bradycardia, unspecified: Secondary | ICD-10-CM | POA: Diagnosis not present

## 2015-02-04 DIAGNOSIS — I4892 Unspecified atrial flutter: Secondary | ICD-10-CM | POA: Diagnosis not present

## 2015-02-04 DIAGNOSIS — Z7901 Long term (current) use of anticoagulants: Secondary | ICD-10-CM

## 2015-02-04 DIAGNOSIS — I4581 Long QT syndrome: Secondary | ICD-10-CM

## 2015-02-04 DIAGNOSIS — I10 Essential (primary) hypertension: Secondary | ICD-10-CM | POA: Diagnosis not present

## 2015-02-04 DIAGNOSIS — I471 Supraventricular tachycardia: Secondary | ICD-10-CM | POA: Diagnosis not present

## 2015-02-04 DIAGNOSIS — R9431 Abnormal electrocardiogram [ECG] [EKG]: Secondary | ICD-10-CM | POA: Diagnosis not present

## 2015-02-04 MED ORDER — APIXABAN 5 MG PO TABS: 5.0000 mg | ORAL_TABLET | Freq: Two times a day (BID) | ORAL | Status: AC

## 2015-02-04 MED ORDER — DILTIAZEM HCL ER COATED BEADS 180 MG PO CP24: 180.0000 mg | ORAL_CAPSULE | Freq: Every day | ORAL | Status: AC

## 2015-02-04 MED ORDER — DILTIAZEM HCL ER COATED BEADS 120 MG PO CP24
120.0000 mg | ORAL_CAPSULE | Freq: Every day | ORAL | Status: DC
  Administered 2015-02-04: 120 mg via ORAL
  Filled 2015-02-04: qty 1

## 2015-02-04 NOTE — Progress Notes (Signed)
Pt. given discharge instructions, questions answered. Daughter and husband at the bedside. IV and tele removed. Pt states she is feeling well. Taken out via wheelchair.

## 2015-02-04 NOTE — Discharge Summary (Signed)
     Patient ID: [name],  MRN: [id number], DOB/AGE: 1933/04/07 81 y.o.  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 She was admitted 02/02/15 with atrial flutter with RVR. She converted spontaneously to NSR with Diltiazem. Eliquis started this admission. The morning of discharge she went from NSR, sinus bradycardia at 49 to a PAT with variable AV block. Her rate was 108 and she was asymptomatic. Dr Anne FuSkains feels she can be discharged and will be set up for an OP monitor and echo. She'll f/u with Dr Eden EmmsNishan or an APP in 1-2 weeks as a level 2 TOC.    Discharge Vitals:  Blood pressure 138/55, pulse 59, temperature 98.1 F (36.7 C), temperature source Oral, resp. rate 20, height 5' 3.5" (1.613 m), weight 151 lb 8 oz (68.72 kg), SpO2 95 %.    Labs: No results found for this or any previous visit (from the past 24 hour(s)).  Disposition:  Follow-up Information    Follow up with Charlton HawsPeter Nishan, MD.   Specialty:  Cardiology   Why:  offcie will contact you   Contact information:   [address] [place] [address] [phone]       Discharge Medications:    Medication List    STOP taking these medications        aspirin EC 81 MG tablet      TAKE these medications        apixaban 5 MG Tabs tablet  Commonly known as:  ELIQUIS  Take 1 tablet (5 mg total) by mouth 2 (two) times daily.     diltiazem 180 MG 24 hr capsule  Commonly known as:  CARDIZEM CD  Take 1 capsule (180 mg total) by mouth daily.     hydrochlorothiazide 25 MG tablet  Commonly known as:  HYDRODIURIL  Take 25 mg by mouth daily.     sertraline 50 MG tablet  Commonly known as:  ZOLOFT  Take 25 mg by mouth daily. Pt states that she takes 1/2 tablet by mouth in the AM      ASK your doctor about these medications        propranolol 10 MG tablet  Commonly known as:  INDERAL  Take 1 tablet (10 mg total) by mouth as needed.         Duration of Discharge Encounter: Greater than 30 minutes including physician time.  Jolene ProvostSigned, Chimamanda Siegfried PA-C 02/04/2015 9:18 AM

## 2015-02-04 NOTE — Progress Notes (Signed)
    Subjective:  No complaints overnight.   Objective:  Vital Signs in the last 24 hours: Temp:  [97.7 F (36.5 C)-98.1 F (36.7 C)] 98.1 F (36.7 C) (07/10 0506) Pulse Rate:  [58-134] 59 (07/10 0506) Resp:  [20] 20 (07/10 0506) BP: (114-138)/(55-78) 138/55 mmHg (07/10 0506) SpO2:  [95 %-98 %] 95 % (07/10 0506) Weight:  [151 lb 8 oz (68.72 kg)] 151 lb 8 oz (68.72 kg) (07/10 0506)  Intake/Output from previous day:  Intake/Output Summary (Last 24 hours) at 02/04/15 0830 Last data filed at 02/04/15 0600  Gross per 24 hour  Intake    600 ml  Output   1050 ml  Net   -450 ml    Physical Exam: General appearance: alert, cooperative and no distress Neck: no carotid bruit and no JVD Lungs: clear to auscultation bilaterally Heart: regular rate and rhythm Extremities: extremities normal, atraumatic, no cyanosis or edema Skin: Skin color, texture, turgor normal. No rashes or lesions Neurologic: Grossly normal   Rate: 48  Rhythm: normal sinus rhythm, sinus bradycardia and first degree AVB  Lab Results:  Recent Labs  02/02/15 1146  WBC 5.2  HGB 14.1  PLT 205    Recent Labs  02/02/15 1146  NA 136  K 3.7  CL 99*  CO2 29  GLUCOSE 108*  BUN 15  CREATININE 0.86   No results for input(s): TROPONINI in the last 72 hours.  Invalid input(s): CK, MB  Recent Labs  02/02/15 1146  INR 1.05    Scheduled Meds: . apixaban  5 mg Oral BID  . diltiazem  120 mg Oral Daily  . hydrochlorothiazide  25 mg Oral Daily  . sertraline  25 mg Oral Daily   Continuous Infusions:  PRN Meds:.   Imaging: No results found.   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 She converted spontaneously to NSR with Diltiazem. Eliquis started this admission.   Principal Problem:   Atrial flutter with rapid ventricular response Active Problems:   Hypertension  Prolonged QT interval   Bradycardia   First degree AV block   Chronic anticoagulation-new 02/03/15- CHADs VASc =4   PLAN: Check EKG this am, she had prolonged QTc -530 on 7/8. 240 mg of Diltiazem may be too much, will give her 120 mg this and review discharge dose with MD. Possible discharge later today.  Corine ShelterLuke Kilroy PA-C 02/04/2015, 8:30 AM [phone]  Personally seen and examined. Agree with above. HR transient in the 100 range. Will DC on 180CD of dilt.   Close follow up.   Donato SchultzSKAINS, Rayvon Brandvold, MD

## 2015-02-05 ENCOUNTER — Telehealth: Payer: Self-pay

## 2015-02-05 ENCOUNTER — Other Ambulatory Visit: Payer: Self-pay | Admitting: *Deleted

## 2015-02-05 ENCOUNTER — Telehealth: Payer: Self-pay | Admitting: Cardiovascular Disease

## 2015-02-05 NOTE — Telephone Encounter (Signed)
Prior auth for Eliquis 5mg  sent to [facility] via Cover My Meds.

## 2015-02-05 NOTE — Telephone Encounter (Signed)
PT  CALLING   WANTING   TO KNOW IF NEEDS TO  TAKE  ASA   ALONG WITH ELIQUIS . WILL FORWARD TO  DR Eden EmmsNISHAN  FOR  REVIEW./CY

## 2015-02-05 NOTE — Telephone Encounter (Signed)
New Message        Pt calling wanting to know if she is still suppose to be taking her Asprin. Please call back and advise.

## 2015-02-06 ENCOUNTER — Telehealth: Payer: Self-pay | Admitting: Cardiovascular Disease

## 2015-02-06 NOTE — Telephone Encounter (Signed)
Follow up       Talk to the nurse regarding her appt with Scott-----she would not go into details

## 2015-02-06 NOTE — Telephone Encounter (Signed)
No aspirin 

## 2015-02-06 NOTE — Telephone Encounter (Signed)
Patient's phone is busy. Patient has an appointment with Christine NewcomerScott Weaver PA on 02/13/15 at 11:00 am.   Called patient again. Patient is worried about her new medications and seeing a PA instead of [name]. Explained to patient the use and need for the medications she is on for her condition. Informed patient that her appointment with [name] is a follow-up from the hospital to make sure her heart is working well with her new medications. Informed patient there are other drugs out there that can help with possible blood clots other than eliquis. Encourage her to  discuss these options at her office visit. Patient's family is also concerned and informed patient that she is more then welcome to let her daughters come with her on her visit. Explained to the patient that [name] is still her doctor and she will see him at her regular appointments. Patient verbalized understanding.

## 2015-02-06 NOTE — Telephone Encounter (Signed)
[name] called and asked me to schedule an appt per Dr. Eden EmmsNishan for pt to see Dr. Ladona Ridgelaylor to discuss SVT/A Flutter ablation, pt was not aware of this and has lots of questions pls call

## 2015-02-06 NOTE — Telephone Encounter (Signed)
[name] is at [facility] today.  Routing to Bed Bath & BeyondKelly.  [name] did you want to talk to patient regarding her questions about her referral to Dr. Ladona Ridgelaylor for ablation?

## 2015-02-07 NOTE — Telephone Encounter (Signed)
PT  AWARE NOT  TO  TAKE  AS./CY

## 2015-02-12 ENCOUNTER — Telehealth: Payer: Self-pay | Admitting: Physician Assistant

## 2015-02-12 NOTE — Telephone Encounter (Signed)
New Message     Pt wants me to inform Tereso NewcomerScott Weaver that she will be able to attend her appt 7/19 @ 11:30AM  however,  She will be accompanied by her two daughters and her husband. She is worried that they will be to over the TOP with their Questions from there online searching  She wants to apologize for any frustrations that they may cause, she said they are good girls they are just very worried.

## 2015-02-12 NOTE — Telephone Encounter (Signed)
Forwarded message to Loews CorporationScott W. PA for FYI as per pt's message

## 2015-02-12 NOTE — Progress Notes (Signed)
Cardiology Office Note   Date:  02/13/2015   ID:  [name], DOB 1933/07/10, MRN [id number]  PCP:  Allean FoundSMITH,CANDACE THIELE, MD  Cardiologist:  Dr. Charlton HawsPeter Nishan     Chief Complaint  Patient presents with  . Atrial Flutter     History of Present Illness: [name] is a 81 y.o. female with a hx of PSVT tx with Adenosine in 06/2013.  Last seen by Dr. Charlton HawsPeter Nishan 02/02/15.  She was in AFlutter with RVR.  She was admitted from the office 7/8-7/10.  She was placed on IV Diltiazem and Eliquis 5 mg Twice daily.  She converted spontaneously to NSR.  On the morning of DC she developed sinus brady with HR 49 and then PAT with variable AV block with HR 108.  She was DC to home with plans to get an OP monitor and Echo.  She returns for FU.    She is here with her husband and 2 daughters. They have many questions and I have tried to answer all of them for her today.  The patient denies chest pain, shortness of breath, syncope, orthopnea, PND or significant pedal edema.    Studies/Reports Reviewed Today:  Echo 07/27/13 - EF 55% to 60%. Wallmotion was normal; Grade 1 diastolic dysfunction). - Aortic valve: There was no stenosis. Mild regurgitation. - Mitral valve: Mildly calcified annulus. Normal thicknessleaflets . No significant regurgitation. - Left atrium: The atrium was mildly dilated. - Right ventricle: The cavity size was normal. Systolicfunction was normal. - Tricuspid valve: Peak RV-RA gradient: 15mm Hg (S). - Pulmonary arteries: PA peak pressure: 20mm Hg (S). Impressions:  - Normal LV size and systolic function, EF 55-60%. Normal RVsize and systolic function. Mild aortic insufficiency.  Myoview 08/2005 No ischemia EF 69%   Past Medical History  Diagnosis Date  . Hypertension   . SVT (supraventricular tachycardia)   . Anxiety     Past Surgical History  Procedure Laterality Date  . Appendectomy    . Hernia repair    . Tubal ligation       Current Outpatient  Prescriptions  Medication Sig Dispense Refill  . apixaban (ELIQUIS) 5 MG TABS tablet Take 1 tablet (5 mg total) by mouth 2 (two) times daily. 60 tablet 11  . diltiazem (CARDIZEM CD) 180 MG 24 hr capsule Take 1 capsule (180 mg total) by mouth daily. 30 capsule 11  . hydrochlorothiazide (HYDRODIURIL) 25 MG tablet Take 25 mg by mouth daily.    . sertraline (ZOLOFT) 50 MG tablet Take 25 mg by mouth daily. Pt states that she takes 1/2 tablet by mouth in the AM     No current facility-administered medications for this visit.    Allergies:   Codeine and Sulfa antibiotics    Social History:  The patient  reports that she has quit smoking. She has never used smokeless tobacco. She reports that she does not drink alcohol.   Family History:  The patient's family history includes COPD in her father; Cancer in her paternal grandmother and sister; Hypertension in her mother; Stroke in her brother.    ROS:   Please see the history of present illness.   Review of Systems  HENT: Positive for hearing loss.   Psychiatric/Behavioral: The patient is nervous/anxious.   All other systems reviewed and are negative.     PHYSICAL EXAM: VS:  BP 146/80 mmHg  Pulse 67  Ht 5\' 3"  (1.6 m)  Wt 152 lb (68.947 kg)  BMI  26.93 kg/m2    Wt Readings from Last 3 Encounters:  02/13/15 152 lb (68.947 kg)  02/04/15 151 lb 8 oz (68.72 kg)  01/05/14 154 lb 12.8 oz (70.217 kg)     GEN: Well nourished, well developed, in no acute distress HEENT: normal Neck: no JVD  no masses Cardiac:  Normal S1/S2, RRR; no murmur ,  no rubs or gallops, no edema   Respiratory:  clear to auscultation bilaterally, no wheezing, rhonchi or rales. GI: soft, nontender, nondistended, + BS MS: no deformity or atrophy Skin: warm and dry  Neuro:  CNs II-XII intact, Strength and sensation are intact Psych: Normal affect   EKG:  EKG is ordered today.  It demonstrates:   NSR, HR 67, LAD, IVCD, QTc 460 ms   Recent Labs: 02/02/2015: BUN 15;  Creatinine, Ser 0.86; Hemoglobin 14.1; Platelets 205; Potassium 3.7; Sodium 136; TSH 1.101    Lipid Panel No results found for: CHOL, TRIG, HDL, CHOLHDL, VLDL, LDLCALC, LDLDIRECT    ASSESSMENT AND PLAN:  Atrial flutter, paroxysmal:  Maintaining NSR. Echo pending.  Continue Eliquis and Diltiazem.  Holter monitor is pending.    Chronic anticoagulation-new 02/03/15- CHADs VASc =4:  Refer to CVRR as Eliquis is new and she is 81 yo.  Check BMET, CBC in 6 weeks.   Essential hypertension:  Controlled.   Prolonged QT interval:  QT today ok.    Medication Changes: Current medicines are reviewed at length with the patient today.  Concerns regarding medicines are as outlined above.  The following changes have been made:   Discontinued Medications   No medications on file   Modified Medications   No medications on file   New Prescriptions   No medications on file     Labs/ tests ordered today include:   Orders Placed This Encounter  Procedures  . Basic Metabolic Panel (BMET)  . CBC w/Diff  . EKG 12-Lead     Disposition:   FU with Dr. Charlton Haws 8-10 weeks.    Signed, Brynda Rim, MHS 02/13/2015 5:33 PM    [facility] [address] [place], [place], [place]  [address] Phone: [phone]; Fax: [phone]

## 2015-02-13 ENCOUNTER — Ambulatory Visit (INDEPENDENT_AMBULATORY_CARE_PROVIDER_SITE_OTHER): Payer: Medicare Other

## 2015-02-13 ENCOUNTER — Ambulatory Visit (HOSPITAL_COMMUNITY): Payer: Medicare Other | Attending: Cardiology

## 2015-02-13 ENCOUNTER — Ambulatory Visit (INDEPENDENT_AMBULATORY_CARE_PROVIDER_SITE_OTHER): Payer: Medicare Other | Admitting: Physician Assistant

## 2015-02-13 ENCOUNTER — Other Ambulatory Visit: Payer: Self-pay

## 2015-02-13 ENCOUNTER — Encounter: Payer: Self-pay | Admitting: Physician Assistant

## 2015-02-13 VITALS — BP 146/80 | HR 67 | Ht 63.0 in | Wt 152.0 lb

## 2015-02-13 DIAGNOSIS — I491 Atrial premature depolarization: Secondary | ICD-10-CM | POA: Insufficient documentation

## 2015-02-13 DIAGNOSIS — I10 Essential (primary) hypertension: Secondary | ICD-10-CM

## 2015-02-13 DIAGNOSIS — I471 Supraventricular tachycardia: Secondary | ICD-10-CM | POA: Diagnosis not present

## 2015-02-13 DIAGNOSIS — R9431 Abnormal electrocardiogram [ECG] [EKG]: Secondary | ICD-10-CM

## 2015-02-13 DIAGNOSIS — I4581 Long QT syndrome: Secondary | ICD-10-CM | POA: Diagnosis not present

## 2015-02-13 DIAGNOSIS — I4892 Unspecified atrial flutter: Secondary | ICD-10-CM

## 2015-02-13 DIAGNOSIS — Z7901 Long term (current) use of anticoagulants: Secondary | ICD-10-CM

## 2015-02-13 DIAGNOSIS — I351 Nonrheumatic aortic (valve) insufficiency: Secondary | ICD-10-CM | POA: Insufficient documentation

## 2015-02-13 NOTE — Patient Instructions (Signed)
Medication Instructions:  Your physician recommends that you continue on your current medications as directed. Please refer to the Current Medication list given to you today.   Labwork: 6 WEEKS FOR BMET, CBC W/DIFF  Testing/Procedures: NONE  Follow-Up: 1. YOU WILL NEED TO SEE THE COUMADIN CLINIC IN 6 WEEKS FOR NEW START ELIQUIS  2. 8-10 WEEKS WITH DR. Eden EmmsNISHAN  Any Other Special Instructions Will Be Listed Below (If Applicable).

## 2015-02-15 ENCOUNTER — Encounter: Payer: Self-pay | Admitting: Cardiovascular Disease

## 2015-02-15 NOTE — Telephone Encounter (Signed)
Patient states she is returning a call to [place].

## 2015-02-15 NOTE — Telephone Encounter (Signed)
This encounter was created in error - please disregard.

## 2015-02-19 ENCOUNTER — Telehealth: Payer: Self-pay | Admitting: Cardiovascular Disease

## 2015-02-19 ENCOUNTER — Telehealth: Payer: Self-pay | Admitting: *Deleted

## 2015-02-19 MED ORDER — LISINOPRIL 10 MG PO TABS: 10.0000 mg | ORAL_TABLET | Freq: Every day | ORAL | Status: AC

## 2015-02-19 NOTE — Telephone Encounter (Signed)
Pt said she was returning your call. She did not understand why you were calling her,she is Dr [name] patient.

## 2015-02-19 NOTE — Telephone Encounter (Signed)
ATTEMPTED TO CALL PT  RE  MONITOR  RESULTS   REVIEWED BY DR  Johney Frame   MONITOR SHOWS ASYMPTOMATIC PAUSES   LONG RP SVT    MESSAGE SENT TO  TO [facility]  TO GET  APPT NEXT  WEEK WITH DR Ladona Ridgel .[name]

## 2015-02-19 NOTE — Telephone Encounter (Signed)
Left message for pt to call for echo results

## 2015-02-19 NOTE — Telephone Encounter (Signed)
Spoke with pt, aware of echo results and new medication. New script sent to the pharmacy

## 2015-02-19 NOTE — Telephone Encounter (Signed)
PT  AWARE DR  Eden Emms  REFERRED   TO EP FOR CONSULT.  PT  IS WILLING TO  PROCEED WITH  MAKING AN APPT  WILL FORWARD TO   [facility]  TO MAKE  APPT WITH DR Ladona Ridgel .[name]

## 2015-02-19 NOTE — Telephone Encounter (Signed)
Tresa Endo states she already talked to this patient and pt wants to speak to Dr, Fabio Bering nurse since he is her doctor and the one referring her

## 2015-02-21 NOTE — Telephone Encounter (Signed)
Follow up    Pt has a few questions she would like to discuss with you. Please call

## 2015-02-21 NOTE — Telephone Encounter (Signed)
PT  AWARE OF  MONITOR RESULTS IS WILLING TO PROCEED  WITH  AN APPT WITH DR Ladona Ridgel  WILL FORWARD TO [facility]   TO MAKE AN APPT .[name]

## 2015-03-01 ENCOUNTER — Telehealth: Payer: Self-pay | Admitting: Cardiovascular Disease

## 2015-03-01 NOTE — Telephone Encounter (Signed)
New message      Talk to the nurse about scheduling an appt with Dr Ladona Ridgel about having an ablation.  Please call

## 2015-03-01 NOTE — Telephone Encounter (Signed)
Spoke with Efraim Kaufmann- EP scheduler. She is aware of the patient's need for an appointment and will be in touch with her to schedule. Message being forwarded to [facility].

## 2015-03-05 ENCOUNTER — Telehealth: Payer: Self-pay | Admitting: Cardiovascular Disease

## 2015-03-05 NOTE — Telephone Encounter (Signed)
New Message      Pt calling stating that she has never found out her results from her heart monitor. Pt is very upset that she feels like no one has explained to her what is going on with her healthcare and why she needs to see Dr. Ladona Ridgel. She also wants to know why her appt w/ Dr. Eden Emms is so far out. Pt states that someone needs to call her immediately so she can discuss everything. Pt states she retired from [employer] and knows Loni Muse personally and if she needs to call him, she will. Please call back and advise.

## 2015-03-05 NOTE — Telephone Encounter (Signed)
I have advised her to call back to the office sooner if she has dizziness/ pre-syncope/ syncope. She voices understanding.

## 2015-03-05 NOTE — Telephone Encounter (Signed)
I spoke with the patient and explained her monitor results to her again. She was under the impression that her results would be called to her by the MD. I explained that nursing will typically call the results if not emergent.  I answered her questions and explained to her the need for evaluation by Dr. Ladona Ridgel for possible ablation- RP tachycardia with post-termination pauses per Dr. Eden Emms. The patient was concerned about having a heart attack based on the readings.  I advised her that this is more of an electrical issue and that she should be evaluated to see if anything further needs to be done.  She is agreeable with seeing Dr. Ladona Ridgel on 03/28/15 at 10:30 am. I have advised her she should see Dr. Ladona Ridgel prior to following back up with Dr. Eden Emms. She is agreeable and voices understanding. She is aware of the location of our office.

## 2015-03-27 ENCOUNTER — Other Ambulatory Visit (INDEPENDENT_AMBULATORY_CARE_PROVIDER_SITE_OTHER): Payer: Medicare Other | Admitting: *Deleted

## 2015-03-27 ENCOUNTER — Telehealth: Payer: Self-pay | Admitting: *Deleted

## 2015-03-27 DIAGNOSIS — I4892 Unspecified atrial flutter: Secondary | ICD-10-CM

## 2015-03-27 LAB — BASIC METABOLIC PANEL
BUN: 25 mg/dL — ABNORMAL HIGH (ref 6–23)
CO2: 27 mEq/L (ref 19–32)
Calcium: 8.9 mg/dL (ref 8.4–10.5)
Chloride: 101 mEq/L (ref 96–112)
Creatinine, Ser: 0.91 mg/dL (ref 0.40–1.20)
GFR: 63.13 mL/min (ref >60.00)
GLUCOSE: 110 mg/dL — AB (ref 70–99)
Potassium: 3.7 mEq/L (ref 3.5–5.1)
SODIUM: 136 meq/L (ref 135–145)

## 2015-03-27 LAB — CBC WITH DIFFERENTIAL/PLATELET
BASOS ABS: 0 10*3/uL (ref 0.0–0.1)
Basophils Relative: 0.3 % (ref 0.0–3.0)
Eosinophils Absolute: 0.1 10*3/uL (ref 0.0–0.7)
Eosinophils Relative: 2 % (ref 0.0–5.0)
HCT: 39.7 % (ref 36.0–46.0)
Hemoglobin: 13.4 g/dL (ref 12.0–15.0)
LYMPHS ABS: 1.8 10*3/uL (ref 0.7–4.0)
LYMPHS PCT: 34.8 % (ref 12.0–46.0)
MCHC: 33.8 g/dL (ref 30.0–36.0)
MCV: 91.9 fl (ref 78.0–100.0)
Monocytes Absolute: 0.5 10*3/uL (ref 0.1–1.0)
Monocytes Relative: 10 % (ref 3.0–12.0)
NEUTROS PCT: 52.9 % (ref 43.0–77.0)
Neutro Abs: 2.8 10*3/uL (ref 1.4–7.7)
Platelets: 240 10*3/uL (ref 150.0–400.0)
RBC: 4.32 Mil/uL (ref 3.87–5.11)
RDW: 13.5 % (ref 11.5–15.5)
WBC: 5.2 10*3/uL (ref 4.0–10.5)

## 2015-03-27 NOTE — Telephone Encounter (Signed)
Pt notified of lab results by phone with verbal understanding. Pt verified her appt 8/31 with Dr. Ladona Ridgel at 10:30.

## 2015-03-28 ENCOUNTER — Encounter: Payer: Self-pay | Admitting: Internal Medicine

## 2015-03-28 ENCOUNTER — Ambulatory Visit (INDEPENDENT_AMBULATORY_CARE_PROVIDER_SITE_OTHER): Payer: Medicare Other | Admitting: Internal Medicine

## 2015-03-28 VITALS — BP 108/64 | HR 61 | Ht 62.5 in | Wt 153.0 lb

## 2015-03-28 DIAGNOSIS — I471 Supraventricular tachycardia: Secondary | ICD-10-CM | POA: Diagnosis not present

## 2015-03-28 DIAGNOSIS — I4892 Unspecified atrial flutter: Secondary | ICD-10-CM | POA: Diagnosis not present

## 2015-03-28 DIAGNOSIS — R001 Bradycardia, unspecified: Secondary | ICD-10-CM

## 2015-03-28 NOTE — Progress Notes (Addendum)
      HPI [name] is referred today for evaluation of SVT and pauses. She is a pleasant 81 yo woman with recurrent SVT. She is asymptomatic. She has worn a cardiac monitor which demonstrated her tachycardia at 150/min. She also has asymptomatic pauses of upto 5.3 seconds, mostly at night but also during the day which was demonstrated on a cardiac monitor in July. There was a question as well of atrial flutter which I cannot be certain of. Amazingly she is asymptomatic. She has also developed LBBB. Allergies  Allergen Reactions  . Codeine Nausea Only  . Sulfa Antibiotics Rash     Current Outpatient Prescriptions  Medication Sig Dispense Refill  . apixaban (ELIQUIS) 5 MG TABS tablet Take 1 tablet (5 mg total) by mouth 2 (two) times daily. 60 tablet 11  . diltiazem (CARDIZEM CD) 180 MG 24 hr capsule Take 1 capsule (180 mg total) by mouth daily. 30 capsule 11  . hydrochlorothiazide (HYDRODIURIL) 25 MG tablet Take 25 mg by mouth daily.    Marland Kitchen lisinopril (PRINIVIL,ZESTRIL) 10 MG tablet Take 1 tablet (10 mg total) by mouth daily. 90 tablet 3   No current facility-administered medications for this visit.     Past Medical History  Diagnosis Date  . Hypertension   . SVT (supraventricular tachycardia)   . Anxiety     ROS:   All systems reviewed and negative except as noted in the HPI.   Past Surgical History  Procedure Laterality Date  . Appendectomy    . Hernia repair    . Tubal ligation       Family History  Problem Relation Age of Onset  . COPD Father   . Hypertension Mother   . Stroke Brother   . Cancer Sister   . Cancer Paternal Grandmother     LIVER     Social History   Social History  . Marital Status: Married    Spouse Name: N/A  . Number of Children: N/A  . Years of Education: N/A   Occupational History  . Not on file.   Social History Main Topics  . Smoking status: Former Games developer  . Smokeless tobacco: Never Used     Comment: QUIT SMOKING IN 28  .  Alcohol Use: No  . Drug Use: Not on file  . Sexual Activity: Not on file   Other Topics Concern  . Not on file   Social History Narrative     BP 108/64 mmHg  Pulse 61  Ht 5' 2.5" (1.588 m)  Wt 153 lb (69.4 kg)  BMI 27.52 kg/m2  Physical Exam:  Well appearing 81 yo woman, NAD HEENT: Unremarkable Neck:  No JVD, no thyromegally Back:  No CVA tenderness Lungs:  Clear with no wheezes HEART:  Regular rate rhythm, no murmurs, no rubs, no clicks Abd:  soft, positive bowel sounds, no organomegally, no rebound, no guarding Ext:  2 plus pulses, no edema, no cyanosis, no clubbing Skin:  No rashes no nodules Neuro:  CN II through XII intact, motor grossly intact  EKG - nsr with LBBB, noise artifact  Assess/Plan:

## 2015-03-28 NOTE — Assessment & Plan Note (Signed)
As she is asymptomatic, I have recommended watchful waiting. She will continue cardizem.

## 2015-03-28 NOTE — Assessment & Plan Note (Addendum)
I am not convinced she has atrial flutter but I cannot be sure she does not have it. She is at risk for developing other atrial arrhythmias with LBBB and HTN. I'll discuss whether we should continue anti-coagulation with Dr. Eden Emms.

## 2015-03-28 NOTE — Assessment & Plan Note (Addendum)
She has had some post termination pauses on the monitor of up to 5.3 seconds. She is asymptomatic. I described the signs/symptoms she might experience if she becomes symptomatic with her bradycardia. She will continue cardizem for now though this may need to be discontinued.

## 2015-03-28 NOTE — Patient Instructions (Addendum)
Medication Instructions: - no changes  Labwork: - none  Procedures/Testing: - none  Follow-Up: - Your physician wants you to follow-up in: 6 months with Dr. Ladona Ridgel. You will receive a reminder letter in the mail two months in advance. If you don't receive a letter, please call our office to schedule the follow-up appointment.  Any Additional Special Instructions Will Be Listed Below (If Applicable). - none

## 2015-04-06 ENCOUNTER — Other Ambulatory Visit: Payer: Self-pay | Admitting: Family Medicine

## 2015-04-06 ENCOUNTER — Ambulatory Visit
Admission: RE | Admit: 2015-04-06 | Discharge: 2015-04-06 | Disposition: A | Discharge status: Home / Self Care | Payer: Medicare Other | Source: Ambulatory Visit | Attending: Family Medicine | Admitting: Family Medicine

## 2015-04-06 DIAGNOSIS — L03116 Cellulitis of left lower limb: Secondary | ICD-10-CM

## 2015-04-22 NOTE — Progress Notes (Signed)
Patient ID: [name], female   DOB: 18-Sep-1932, 81 y.o.   MRN: [id number] 81 y.o.  seen in ER 07/05/13  for SVT She has never had a previous heart problem Given adenosine and iv lopressor Despite rates in 160 had no chest pain mild dyspnea and no  Pre synocpe Had a cold but no other ppt factors Had done a lot of yard work and pain in left forearm and hand but this was muscular D/C on metroprolol 25 bid. CRF;s elevated lipids on crestor and HTN. 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 TSH and K normal in ER Reviewed labs HP's and ECGs No preexcitation on ECGs Baseline ECG when not in SVT is normal   This patients CHA2DS2-VASc Score and unadjusted Ischemic Stroke Rate (% per year) is equal to 4.8 % stroke rate/year from a score of 4  Above score calculated as 1 point each if present [CHF, HTN, DM, Vascular=MI/PAD/Aortic Plaque, Age if 65-74, or Female] Above score calculated as 2 points each if present [Age > 75, or Stroke/TIA/TE]   Echo 07/27/13 Study Conclusions  - Left ventricle: The cavity size was normal. Wall thickness was normal. Systolic function was normal. The estimated ejection fraction was in the range of 55% to 60%. Wall motion was normal; there were no regional wall motion abnormalities. Doppler parameters are consistent with abnormal left ventricular relaxation (grade 1 diastolic dysfunction). - Aortic valve: There was no stenosis. Mild regurgitation. - Mitral valve: Mildly calcified annulus. Normal thickness leaflets . No significant regurgitation. - Left atrium: The atrium was mildly dilated. - Right ventricle: The cavity size was normal. Systolic function was normal. - Tricuspid valve: Peak RV-RA gradient: 15mm Hg (S). - Pulmonary arteries: PA peak pressure: 20mm Hg (S). - Inferior vena cava: The vessel was normal in size; the respirophasic diameter changes were in the normal range (= 50%); findings are consistent  with normal central venous pressure. Impressions:  - Normal LV size and systolic function, EF 55-60%. Normal RV size and systolic function. Mild aortic insufficiency.  In office July  in rapid flutter.  Rate 153-160  She has mild dyspnea no chest pain     ROS: Denies fever, malais, weight loss, blurry vision, decreased visual acuity, cough, sputum, SOB, hemoptysis, pleuritic pain, palpitaitons, heartburn, abdominal pain, melena, lower extremity edema, claudication, or rash.  All other systems reviewed and negative  General: Affect appropriate Healthy:  appears stated age HEENT: normal Neck supple with no adenopathy JVP normal no bruits no thyromegaly Lungs clear with no wheezing and good diaphragmatic motion Heart:  S1/S2 no murmur, no rub, gallop or click PMI normal Abdomen: benighn, BS positve, no tenderness, no AAA no bruit.  No HSM or HJR Distal pulses intact with no bruits Trace bilateral edema and varicosities  Neuro non-focal Skin warm and dry No muscular weakness   Current Outpatient Prescriptions  Medication Sig Dispense Refill  . apixaban (ELIQUIS) 5 MG TABS tablet Take 1 tablet (5 mg total) by mouth 2 (two) times daily. 60 tablet 11  . diltiazem (CARDIZEM CD) 180 MG 24 hr capsule Take 1 capsule (180 mg total) by mouth daily. 30 capsule 11  . hydrochlorothiazide (HYDRODIURIL) 25 MG tablet Take 25 mg by mouth daily.    Marland Kitchen lisinopril (PRINIVIL,ZESTRIL) 10 MG tablet Take 1 tablet (10 mg total) by mouth daily. 90 tablet 3   No current facility-administered medications for this visit.    Allergies  Codeine and Sulfa  antibiotics  Electrocardiogram: 06/2014  SR tate 91 normal  02/02/15  Atrial flutter rate 153  IVCD  7/31 SR rate 61 LVH biatrial enlargement LBBB   Assessment and Plan SVT:  Seen by Dr Ladona Ridgel 03/28/15  He was not clear after monitor review if she had any atrial flutter. Since she is asymptomatic with even fast rates and no pauses longer than 3 seconds he  favored medical Rx with cardizem and eliquis  HTN:  On cardizem and diuretic stop lisinopril  F/u Candice smith  LBBB:  Stable yearly ECG no AV block   Edema: and varicosities continue low dose diuretic  F/U with Dr Ladona Ridgel in 6 months

## 2015-04-23 ENCOUNTER — Encounter: Payer: Self-pay | Admitting: Cardiovascular Disease

## 2015-04-23 ENCOUNTER — Ambulatory Visit (INDEPENDENT_AMBULATORY_CARE_PROVIDER_SITE_OTHER): Payer: Medicare Other | Admitting: Cardiovascular Disease

## 2015-04-23 VITALS — BP 110/50 | HR 55 | Ht 62.5 in | Wt 148.1 lb

## 2015-04-23 DIAGNOSIS — I4892 Unspecified atrial flutter: Secondary | ICD-10-CM

## 2015-04-23 DIAGNOSIS — I10 Essential (primary) hypertension: Secondary | ICD-10-CM | POA: Diagnosis not present

## 2015-04-23 NOTE — Patient Instructions (Addendum)
Medication Instructions:  Your physician has recommended you make the following change in your medication: STOP  LISINOPRIL   Labwork: NONE Testing/Procedures: NONE  Follow-Up: Your physician wants you to follow-up in: 6 MONTHS  WITH DR  Court Joy will receive a reminder letter in the mail two months in advance. If you don't receive a letter, please call our office to schedule the follow-up appointment. Any Other Special Instructions Will Be Listed Below (If Applicable).

## 2015-06-28 DEATH — deceased

## 2015-07-02 ENCOUNTER — Telehealth: Payer: Self-pay | Admitting: Cardiovascular Disease

## 2015-07-02 NOTE — Telephone Encounter (Signed)
New message      Calling to let Dr Eden EmmsNishan know pt died nov 30th at home.

## 2015-07-02 NOTE — Telephone Encounter (Signed)
Will route to Dr. Nishan to make him aware.  

## 2015-07-06 NOTE — Telephone Encounter (Signed)
NOTED ./CY 

## 2015-07-29 DEATH — deceased

## 2015-12-17 IMAGING — CR DG TIBIA/FIBULA 2V*L*
4 series · 4 of 4 positions shown · non-contrast
Comparison: None.

CLINICAL DATA: Recent fall.  Initial evaluation.

EXAM:
LEFT TIBIA AND FIBULA - 2 VIEW

[t tib/fib ap left (1 of 2)]
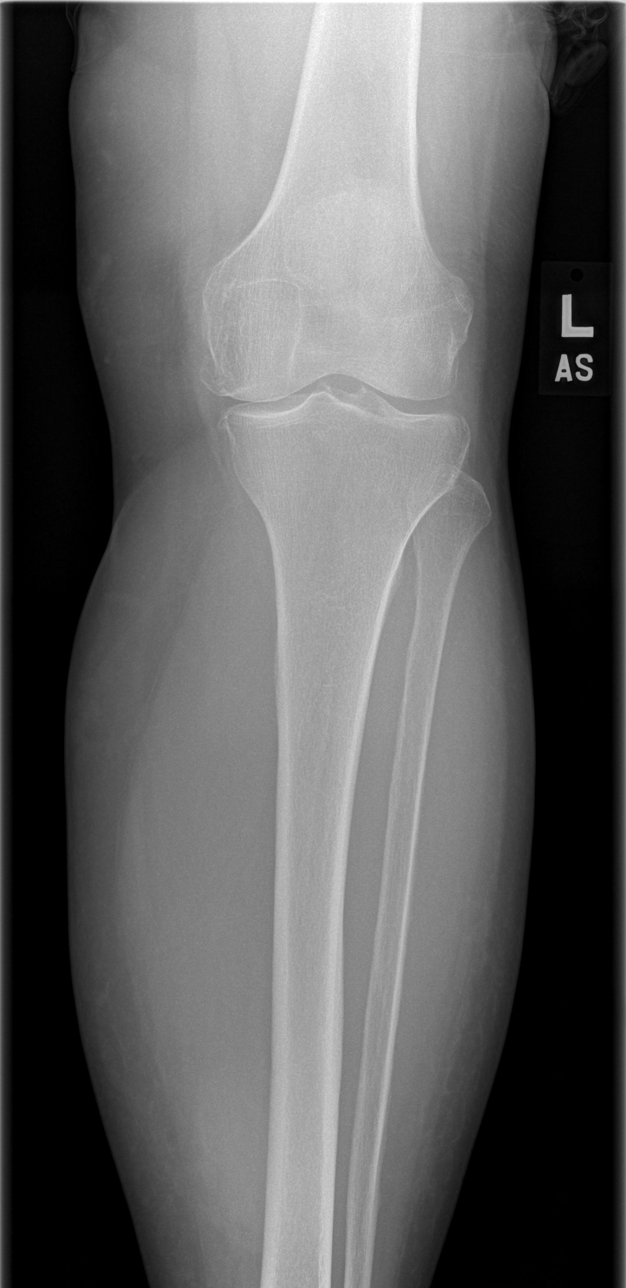

[t tib/fib ap left (2 of 2)]
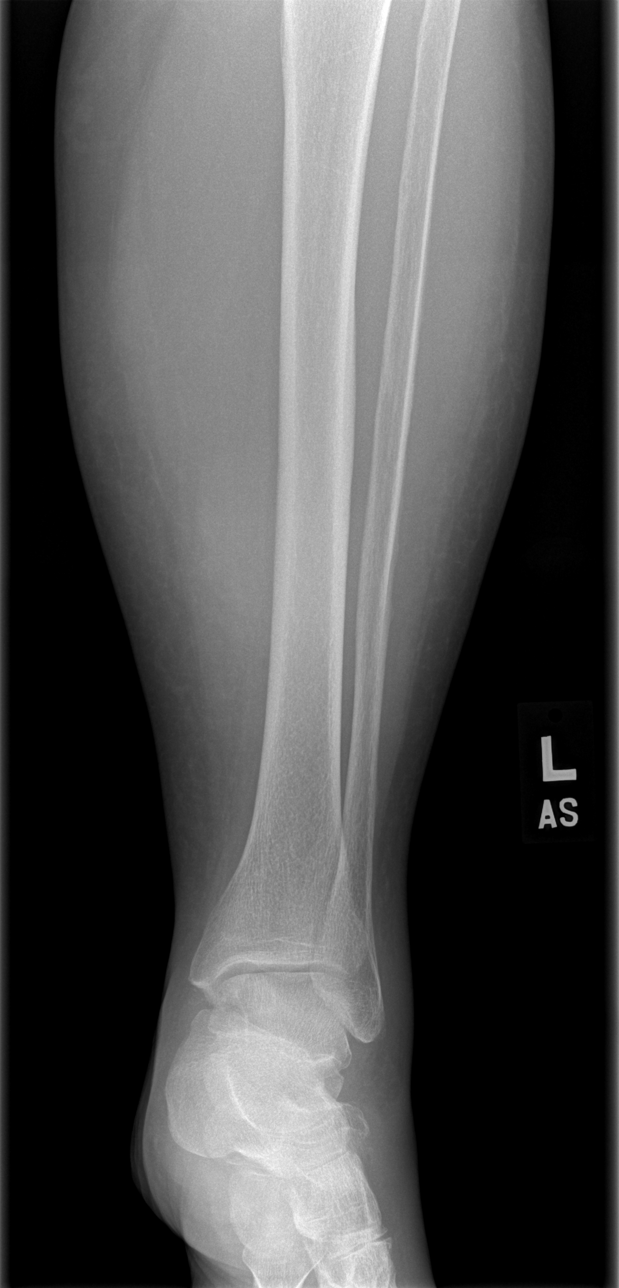

[t tib/fib lat left (1 of 2)]
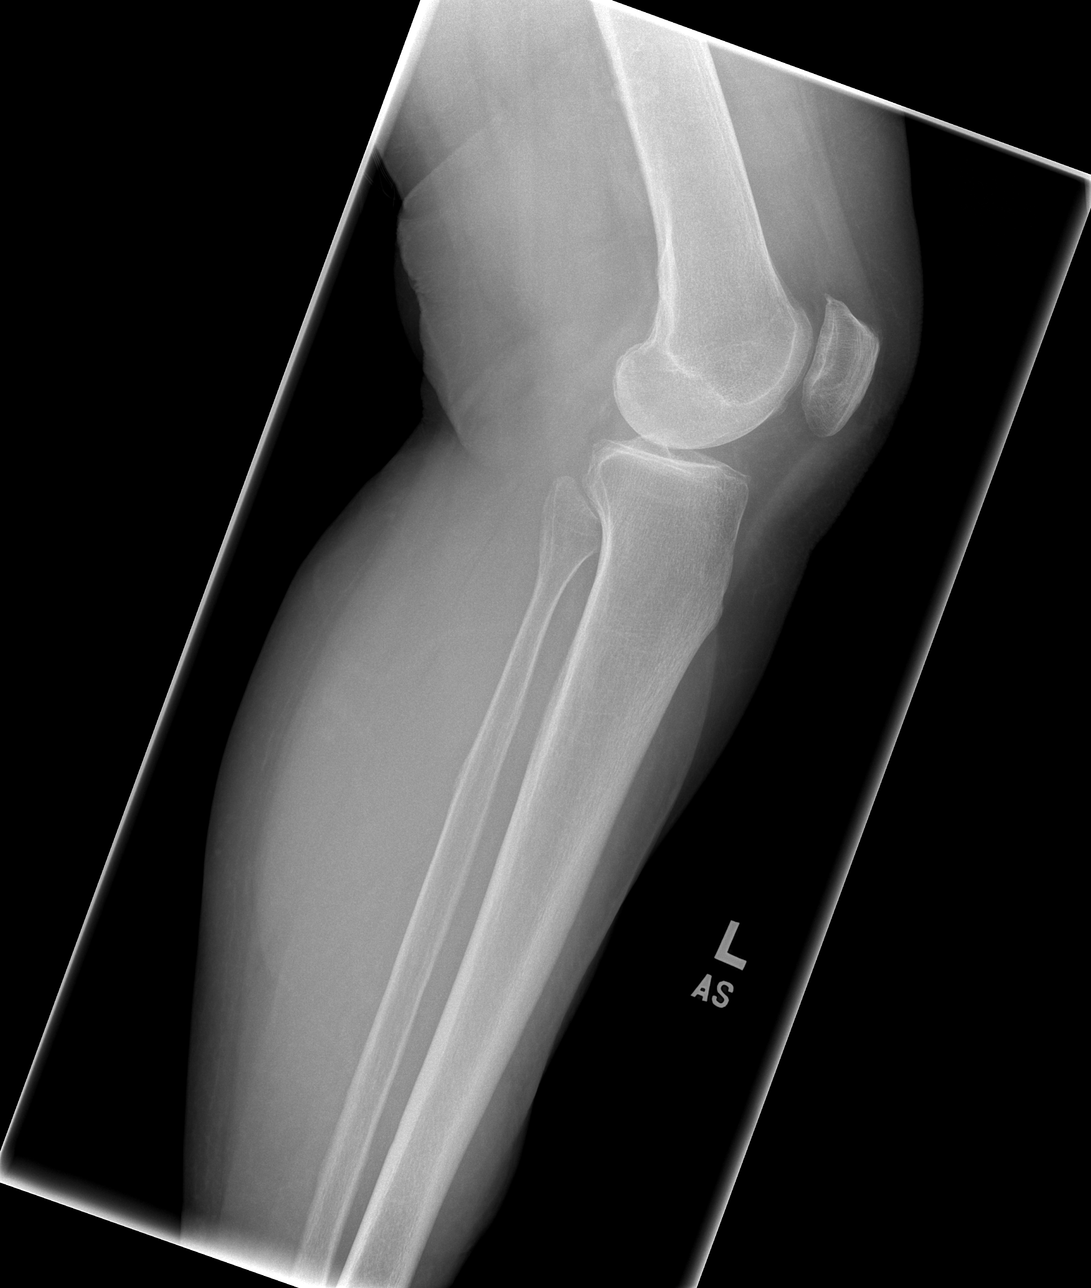

[t tib/fib lat left (2 of 2)]
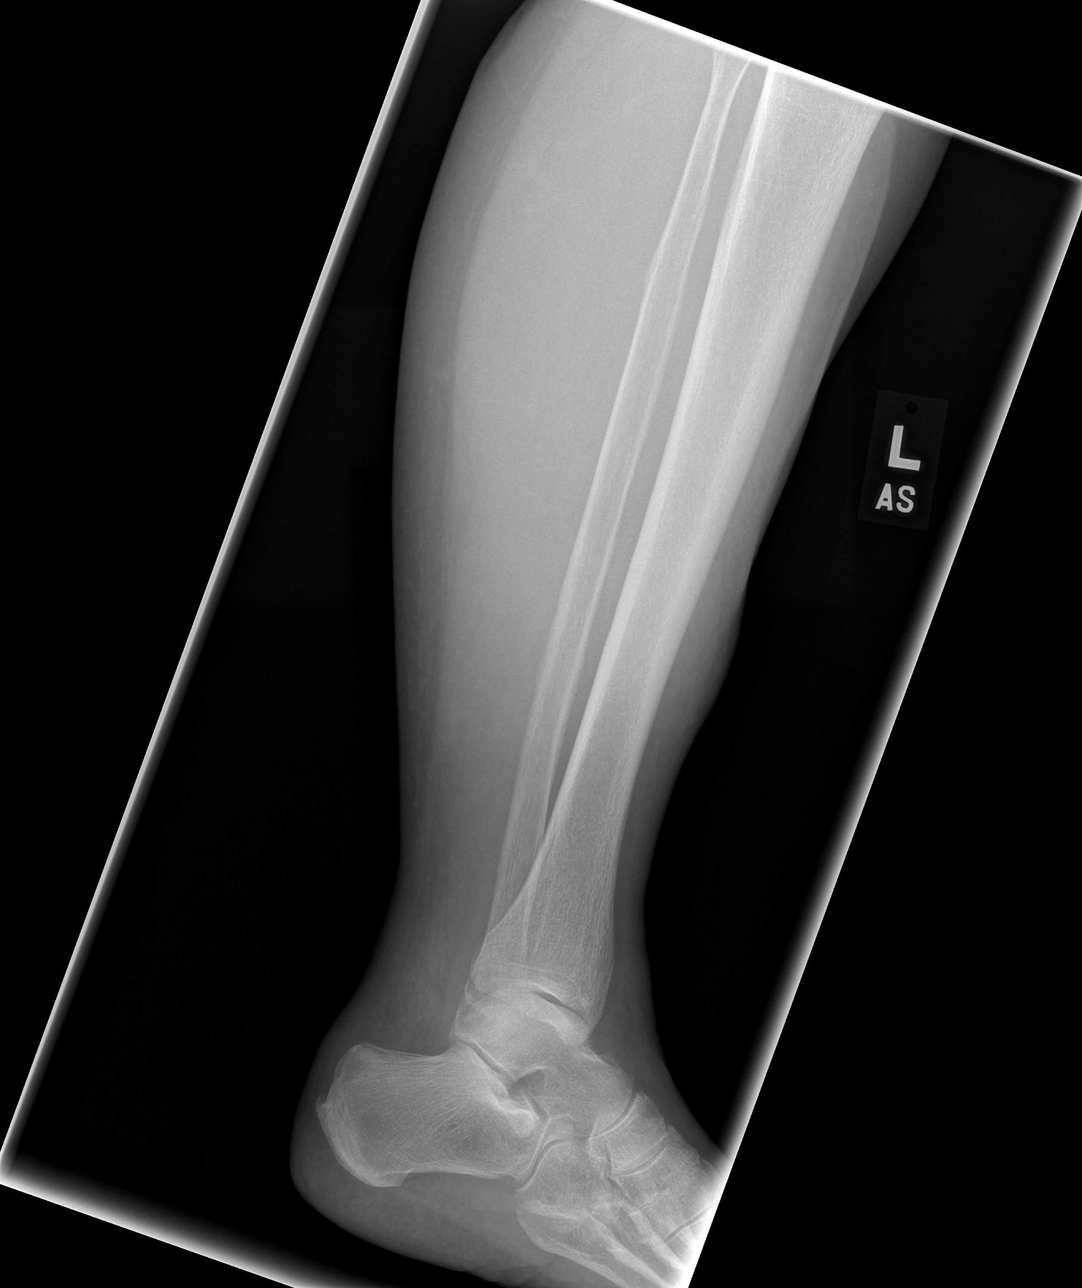

[4 of 4 positions shown; findings below may reference images not displayed]

FINDINGS: There is no evidence of fracture or other focal bone lesions. Soft
tissues are unremarkable.
IMPRESSION: Negative.
